# Patient Record
Sex: Male | Born: 1990 | Race: Black or African American | Hispanic: No | Marital: Single
Health system: Southern US, Community
[De-identification: ages and names within clinical notes are randomized; demographics above are authoritative.]

## PROBLEM LIST (undated history)

## (undated) DIAGNOSIS — J40 Bronchitis, not specified as acute or chronic: Secondary | ICD-10-CM

## (undated) HISTORY — PX: LEG SURGERY: SHX1003

---
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
## 2008-01-01 ENCOUNTER — Emergency Department (HOSPITAL_COMMUNITY): Admission: EM | Admit: 2008-01-01 | Discharge: 2008-01-01 | Payer: Self-pay | Admitting: Emergency Medicine

## 2008-07-23 ENCOUNTER — Emergency Department (HOSPITAL_COMMUNITY): Admission: EM | Admit: 2008-07-23 | Discharge: 2008-07-23 | Payer: Self-pay | Admitting: Emergency Medicine

## 2008-07-28 ENCOUNTER — Emergency Department (HOSPITAL_COMMUNITY): Admission: EM | Admit: 2008-07-28 | Discharge: 2008-07-28 | Payer: Self-pay | Admitting: Emergency Medicine

## 2008-10-09 ENCOUNTER — Emergency Department (HOSPITAL_BASED_OUTPATIENT_CLINIC_OR_DEPARTMENT_OTHER): Admission: EM | Admit: 2008-10-09 | Discharge: 2008-10-09 | Payer: Self-pay | Admitting: Emergency Medicine

## 2009-04-21 ENCOUNTER — Emergency Department (HOSPITAL_COMMUNITY): Admission: EM | Admit: 2009-04-21 | Discharge: 2009-04-21 | Payer: Self-pay | Admitting: Family Medicine

## 2009-06-20 ENCOUNTER — Emergency Department (HOSPITAL_COMMUNITY): Admission: EM | Admit: 2009-06-20 | Discharge: 2009-06-20 | Payer: Self-pay | Admitting: Emergency Medicine

## 2009-06-26 ENCOUNTER — Inpatient Hospital Stay (HOSPITAL_COMMUNITY): Admission: RE | Admit: 2009-06-26 | Discharge: 2009-06-27 | Payer: Self-pay | Admitting: Orthopedic Surgery
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
## 2010-06-24 LAB — CBC
Hemoglobin: 14.7 g/dL (ref 13.0–17.0)
Platelets: 274 10*3/uL (ref 150–400)
RBC: 4.88 MIL/uL (ref 4.22–5.81)
WBC: 3.9 10*3/uL — ABNORMAL LOW (ref 4.0–10.5)

## 2010-06-24 LAB — DIFFERENTIAL
Basophils Relative: 1 % (ref 0–1)
Eosinophils Relative: 1 % (ref 0–5)
Lymphs Abs: 1.4 10*3/uL (ref 0.7–4.0)
Monocytes Absolute: 0.4 10*3/uL (ref 0.1–1.0)
Monocytes Relative: 9 % (ref 3–12)
Neutro Abs: 2.1 10*3/uL (ref 1.7–7.7)

## 2011-05-14 IMAGING — CR DG ANKLE COMPLETE 3+V*R*
3 series · 3 of 3 positions shown · non-contrast
Comparison: None.

CLINICAL DATA: Injury.  Lateral ankle pain.

RIGHT ANKLE - COMPLETE 3+ VIEW

[t ankle joint ap right]
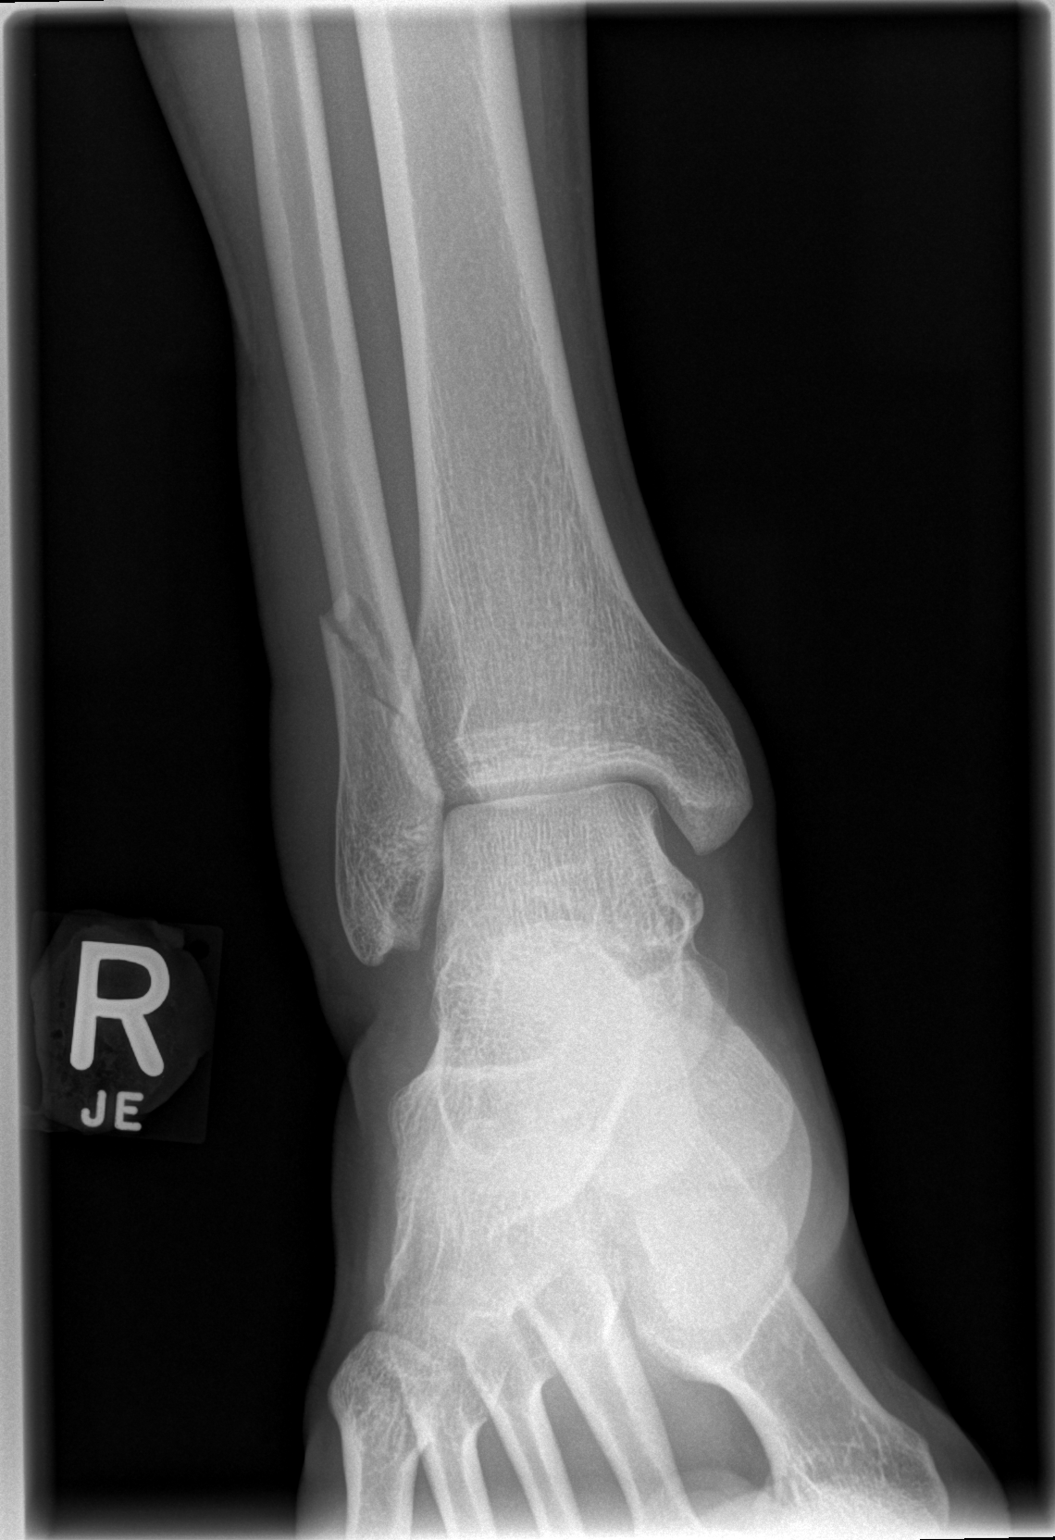

[t ankle joint oblique right]
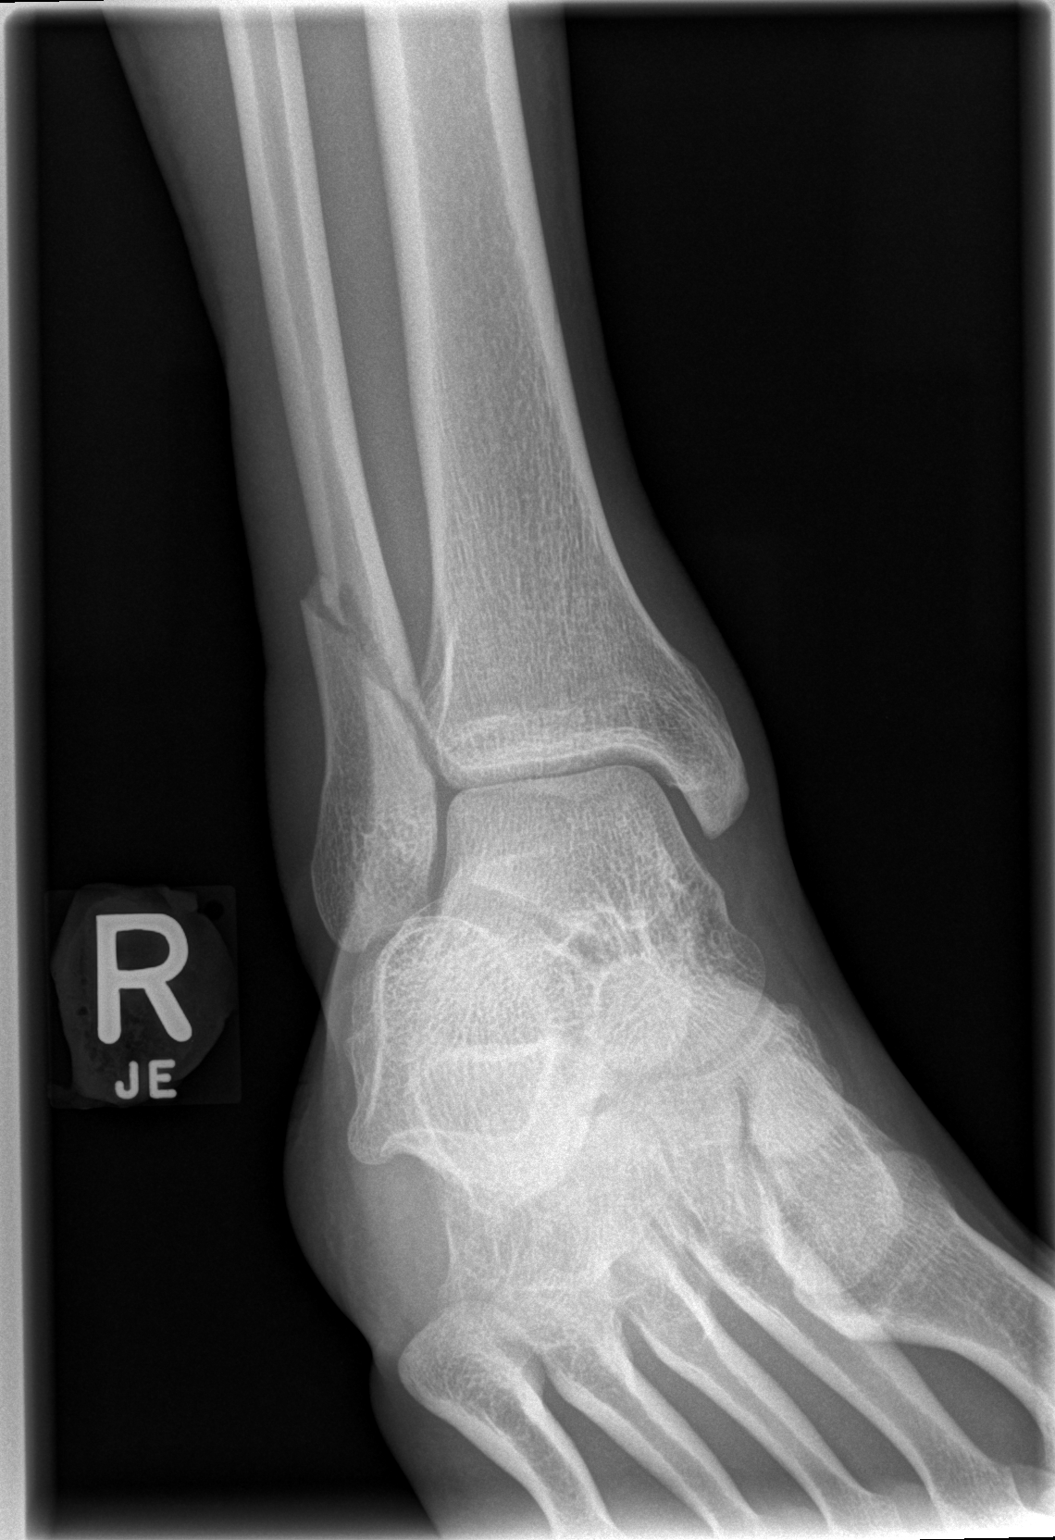

[t ankle joint lat right]
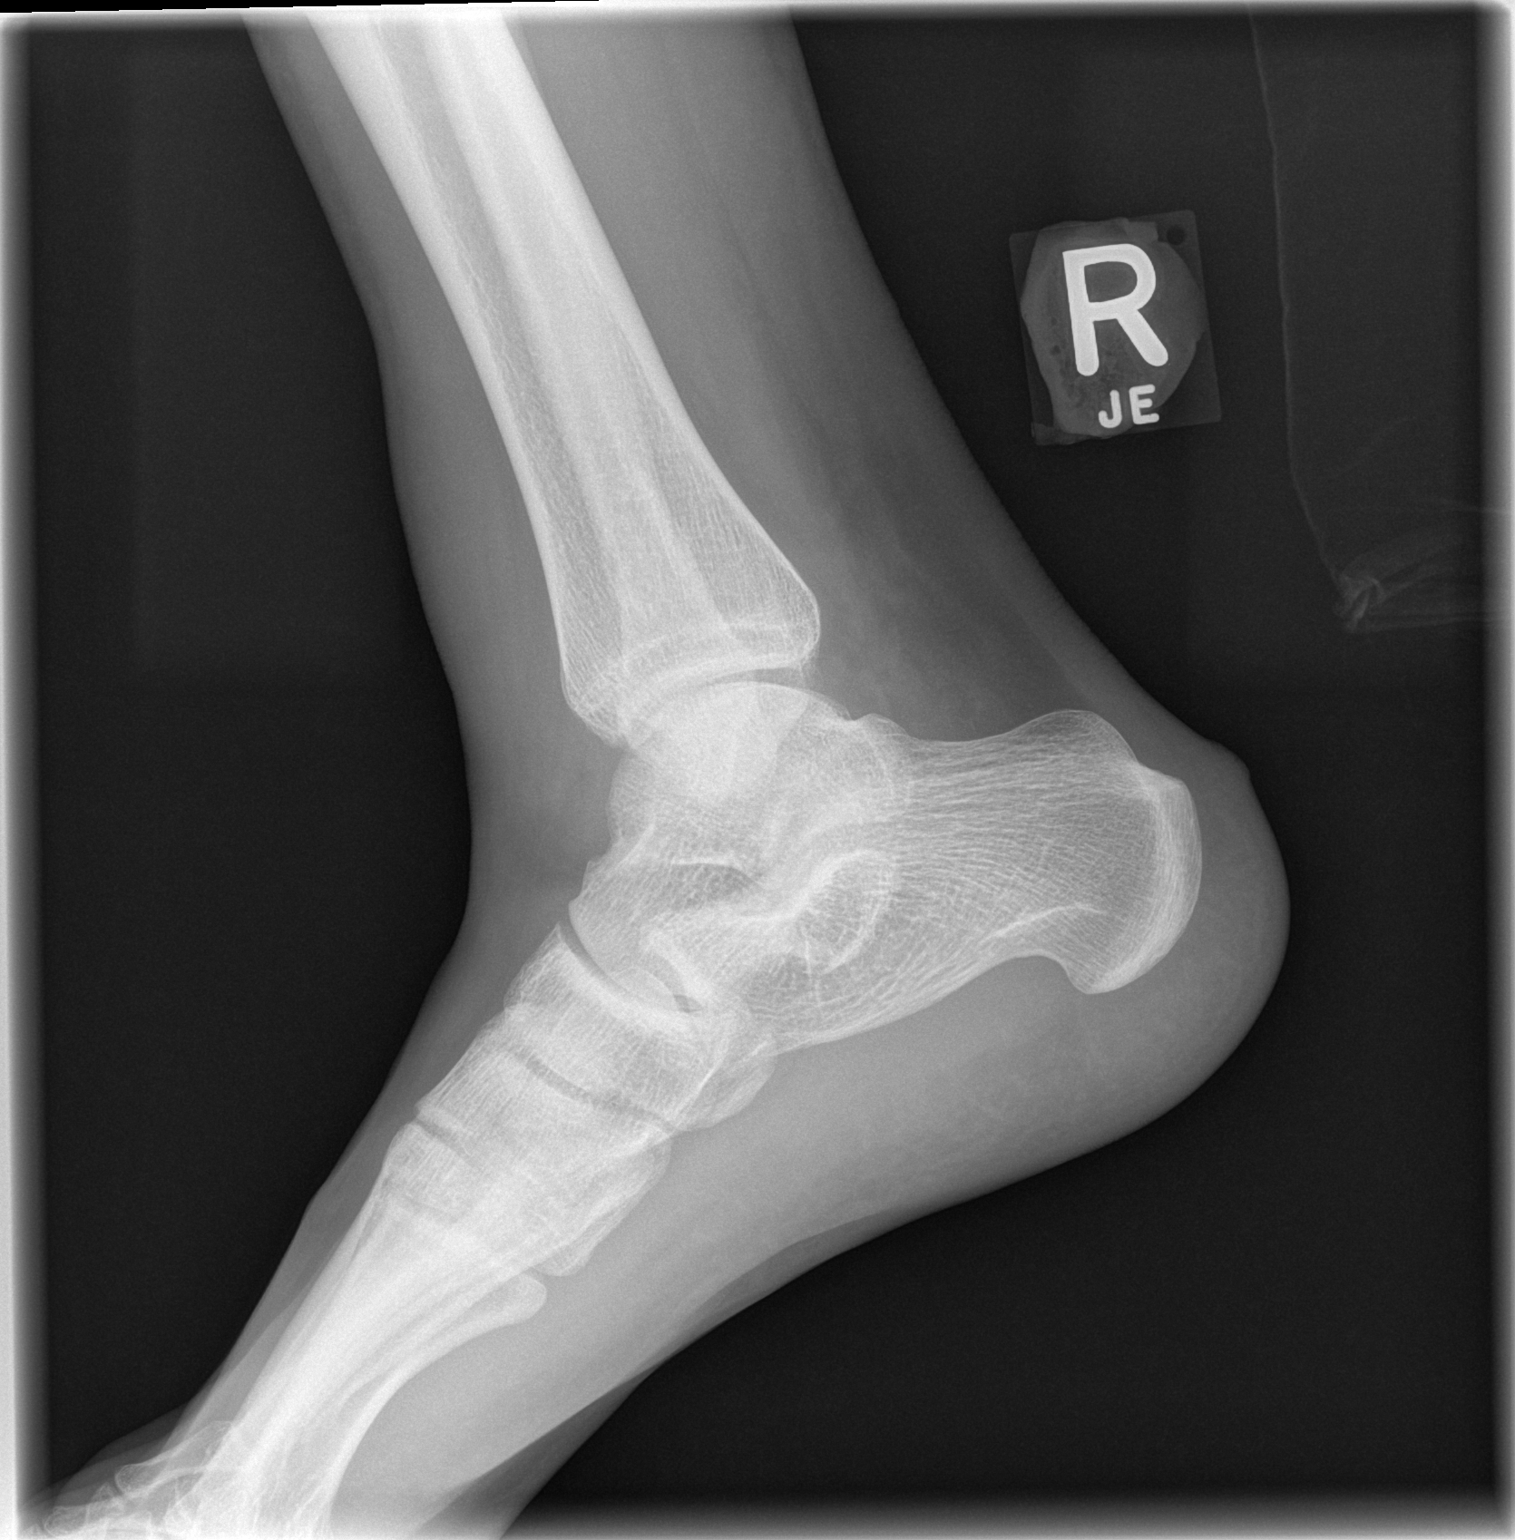

[3 of 3 positions shown; findings below may reference images not displayed]

FINDINGS: Minimally displaced oblique fracture of the distal
fibular shaft.  The tibia is intact. No subluxation.
IMPRESSION: Distal fibular shaft fracture.

## 2013-02-13 ENCOUNTER — Emergency Department (HOSPITAL_COMMUNITY): Payer: Medicare Other

## 2013-02-13 ENCOUNTER — Emergency Department (HOSPITAL_COMMUNITY)
Admission: EM | Admit: 2013-02-13 | Discharge: 2013-02-14 | Disposition: A | Discharge status: Home / Self Care | Payer: Medicare Other | Attending: Emergency Medicine | Admitting: Emergency Medicine

## 2013-02-13 ENCOUNTER — Encounter (HOSPITAL_COMMUNITY): Payer: Self-pay | Admitting: Emergency Medicine

## 2013-02-13 DIAGNOSIS — W34010A Accidental discharge of airgun, initial encounter: Secondary | ICD-10-CM | POA: Insufficient documentation

## 2013-02-13 DIAGNOSIS — Z8709 Personal history of other diseases of the respiratory system: Secondary | ICD-10-CM | POA: Insufficient documentation

## 2013-02-13 DIAGNOSIS — S60551A Superficial foreign body of right hand, initial encounter: Secondary | ICD-10-CM

## 2013-02-13 DIAGNOSIS — Y9389 Activity, other specified: Secondary | ICD-10-CM | POA: Insufficient documentation

## 2013-02-13 DIAGNOSIS — S61409A Unspecified open wound of unspecified hand, initial encounter: Secondary | ICD-10-CM | POA: Insufficient documentation

## 2013-02-13 DIAGNOSIS — Y92009 Unspecified place in unspecified non-institutional (private) residence as the place of occurrence of the external cause: Secondary | ICD-10-CM | POA: Insufficient documentation

## 2013-02-13 DIAGNOSIS — Z23 Encounter for immunization: Secondary | ICD-10-CM | POA: Insufficient documentation

## 2013-02-13 DIAGNOSIS — F17200 Nicotine dependence, unspecified, uncomplicated: Secondary | ICD-10-CM | POA: Insufficient documentation

## 2013-02-13 HISTORY — DX: Bronchitis, not specified as acute or chronic: J40

## 2013-02-13 MED ORDER — TETANUS-DIPHTH-ACELL PERTUSSIS 5-2.5-18.5 LF-MCG/0.5 IM SUSP
0.5000 mL | Freq: Once | INTRAMUSCULAR | Status: AC
  Administered 2013-02-13: 0.5 mL via INTRAMUSCULAR
  Filled 2013-02-13: qty 0.5

## 2013-02-13 MED ORDER — HYDROCODONE-ACETAMINOPHEN 5-325 MG PO TABS
1.0000 | ORAL_TABLET | Freq: Once | ORAL | Status: AC
  Administered 2013-02-13: 1 via ORAL
  Filled 2013-02-13: qty 1
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
## 2013-02-14 MED ORDER — HYDROCODONE-ACETAMINOPHEN 5-325 MG PO TABS: 1.0000 | ORAL_TABLET | Freq: Four times a day (QID) | ORAL | Status: AC | PRN
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
## 2015-01-16 ENCOUNTER — Inpatient Hospital Stay (HOSPITAL_COMMUNITY)
Admission: EM | Admit: 2015-01-16 | Discharge: 2015-01-30 | DRG: 082 | Disposition: E | Payer: Medicare Other | Attending: Surgery | Admitting: Surgery

## 2015-01-16 ENCOUNTER — Encounter (HOSPITAL_COMMUNITY): Payer: Self-pay | Admitting: Emergency Medicine

## 2015-01-16 ENCOUNTER — Emergency Department (HOSPITAL_COMMUNITY): Payer: Medicare Other

## 2015-01-16 ENCOUNTER — Encounter (HOSPITAL_COMMUNITY): Payer: Self-pay | Admitting: Anesthesiology

## 2015-01-16 DIAGNOSIS — J9690 Respiratory failure, unspecified, unspecified whether with hypoxia or hypercapnia: Secondary | ICD-10-CM | POA: Diagnosis not present

## 2015-01-16 DIAGNOSIS — G936 Cerebral edema: Secondary | ICD-10-CM | POA: Diagnosis present

## 2015-01-16 DIAGNOSIS — G935 Compression of brain: Secondary | ICD-10-CM | POA: Diagnosis present

## 2015-01-16 DIAGNOSIS — R001 Bradycardia, unspecified: Secondary | ICD-10-CM | POA: Diagnosis not present

## 2015-01-16 DIAGNOSIS — S0180XA Unspecified open wound of other part of head, initial encounter: Secondary | ICD-10-CM | POA: Diagnosis present

## 2015-01-16 DIAGNOSIS — R402432 Glasgow coma scale score 3-8, at arrival to emergency department: Secondary | ICD-10-CM | POA: Diagnosis present

## 2015-01-16 DIAGNOSIS — S0193XA Puncture wound without foreign body of unspecified part of head, initial encounter: Secondary | ICD-10-CM

## 2015-01-16 DIAGNOSIS — T148 Other injury of unspecified body region: Secondary | ICD-10-CM | POA: Diagnosis present

## 2015-01-16 DIAGNOSIS — R000 Tachycardia, unspecified: Secondary | ICD-10-CM | POA: Diagnosis not present

## 2015-01-16 DIAGNOSIS — W3400XA Accidental discharge from unspecified firearms or gun, initial encounter: Secondary | ICD-10-CM | POA: Diagnosis not present

## 2015-01-16 DIAGNOSIS — I959 Hypotension, unspecified: Secondary | ICD-10-CM | POA: Diagnosis present

## 2015-01-16 DIAGNOSIS — Z66 Do not resuscitate: Secondary | ICD-10-CM | POA: Diagnosis not present

## 2015-01-16 LAB — URINALYSIS, ROUTINE W REFLEX MICROSCOPIC
BILIRUBIN URINE: NEGATIVE
GLUCOSE, UA: NEGATIVE mg/dL
Ketones, ur: NEGATIVE mg/dL
LEUKOCYTES UA: NEGATIVE
NITRITE: NEGATIVE
Specific Gravity, Urine: 1.01 (ref 1.005–1.030)
Urobilinogen, UA: 0.2 mg/dL (ref 0.0–1.0)
pH: 6.5 (ref 5.0–8.0)

## 2015-01-16 LAB — I-STAT ARTERIAL BLOOD GAS, ED
ACID-BASE DEFICIT: 11 mmol/L — AB (ref 0.0–2.0)
BICARBONATE: 18.6 meq/L — AB (ref 20.0–24.0)
O2 SAT: 100 %
PO2 ART: 527 mmHg — AB (ref 80.0–100.0)
TCO2: 20 mmol/L (ref 0–100)
pCO2 arterial: 58.6 mmHg (ref 35.0–45.0)
pH, Arterial: 7.11 — CL (ref 7.350–7.450)

## 2015-01-16 LAB — URINE MICROSCOPIC-ADD ON

## 2015-01-16 LAB — PREPARE FRESH FROZEN PLASMA
UNIT DIVISION: 0
Unit division: 0

## 2015-01-16 LAB — I-STAT CHEM 8, ED
BUN: 11 mg/dL (ref 6–20)
Calcium, Ion: 1.04 mmol/L — ABNORMAL LOW (ref 1.12–1.23)
Chloride: 102 mmol/L (ref 101–111)
Creatinine, Ser: 1.6 mg/dL — ABNORMAL HIGH (ref 0.61–1.24)
GLUCOSE: 184 mg/dL — AB (ref 65–99)
HEMATOCRIT: 45 % (ref 39.0–52.0)
HEMOGLOBIN: 15.3 g/dL (ref 13.0–17.0)
POTASSIUM: 4.3 mmol/L (ref 3.5–5.1)
Sodium: 137 mmol/L (ref 135–145)
TCO2: 17 mmol/L (ref 0–100)

## 2015-01-16 LAB — ABO/RH: ABO/RH(D): B POS

## 2015-01-16 MED ORDER — VANCOMYCIN HCL IN DEXTROSE 1-5 GM/200ML-% IV SOLN
1000.0000 mg | Freq: Two times a day (BID) | INTRAVENOUS | Status: DC
  Filled 2015-01-16: qty 200

## 2015-01-16 MED ORDER — VANCOMYCIN HCL IN DEXTROSE 1-5 GM/200ML-% IV SOLN
1000.0000 mg | Freq: Once | INTRAVENOUS | Status: AC
Start: 2015-01-16 — End: 2015-01-16
  Administered 2015-01-16: 1000 mg via INTRAVENOUS
  Filled 2015-01-16: qty 200

## 2015-01-16 MED ORDER — DEXTROSE 5 % IV SOLN
2.0000 g | Freq: Once | INTRAVENOUS | Status: AC
  Administered 2015-01-16: 2 g via INTRAVENOUS
  Filled 2015-01-16: qty 2

## 2015-01-16 MED ORDER — ROCURONIUM BROMIDE 50 MG/5ML IV SOLN
INTRAVENOUS | Status: AC | PRN
  Administered 2015-01-16: 70 mg via INTRAVENOUS

## 2015-01-16 MED ORDER — ETOMIDATE 2 MG/ML IV SOLN
INTRAVENOUS | Status: AC | PRN
  Administered 2015-01-16: 20 mg via INTRAVENOUS

## 2015-01-16 MED ORDER — SODIUM CHLORIDE 0.9 % IV SOLN
INTRAVENOUS | Status: AC | PRN
  Administered 2015-01-16: 999 mL/h via INTRAVENOUS

## 2015-01-16 MED ORDER — DEXTROSE 5 % IV SOLN
2.0000 g | Freq: Three times a day (TID) | INTRAVENOUS | Status: DC
  Filled 2015-01-16 (×2): qty 2

## 2015-01-16 MED ORDER — TETANUS-DIPHTH-ACELL PERTUSSIS 5-2.5-18.5 LF-MCG/0.5 IM SUSP
0.5000 mL | Freq: Once | INTRAMUSCULAR | Status: AC
  Administered 2015-01-16: 0.5 mL via INTRAMUSCULAR
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
## 2015-01-16 NOTE — ED Notes (Signed)
Foley site appears to not have any issues.
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
## 2015-01-16 NOTE — Progress Notes (Signed)
Time of death 2339. Alphia Mohevon Sutherland RN second witness. Dr. Harlon Florseui, [employer] notified of time of death. Herma ArdMesser, Kao Berkheimer RN BSN.
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
## 2015-01-16 NOTE — Progress Notes (Signed)
Patient coded shortly after arrival to ICU.  Became progressively hypotensive as herniation worsened.  [facility] and after about 3 rounds of epinephrine, he returned to sinus tachycardia.  I had a conversation with the patient and his brother and we have agreed to make him a DNR with no further code since his chance of recovery is almost zero.  The order has been placed in Epic.  Wilmon ArmsMatthew K. Corliss Skainssuei, MD, [facility]  General/ Trauma Surgery  2015-03-18 11:11 PM

## 2015-01-17 ENCOUNTER — Encounter (HOSPITAL_COMMUNITY): Payer: Self-pay | Admitting: Emergency Medicine

## 2015-01-17 LAB — TYPE AND SCREEN
ABO/RH(D): B POS
ANTIBODY SCREEN: NEGATIVE
Unit division: 0
Unit division: 0

## 2015-01-17 MED FILL — Medication: Qty: 1 | Status: AC

## 2015-01-26 DIAGNOSIS — G936 Cerebral edema: Secondary | ICD-10-CM

## 2015-01-30 DEATH — deceased
